# Patient Record
Sex: Male | Born: 1980 | Hispanic: Refuse to answer | Marital: Married | Smoking: Never smoker
Health system: Southern US, Community
[De-identification: ages and names within clinical notes are randomized; demographics above are authoritative.]

## PROBLEM LIST (undated history)

## (undated) DIAGNOSIS — T7840XA Allergy, unspecified, initial encounter: Secondary | ICD-10-CM

## (undated) HISTORY — DX: Allergy, unspecified, initial encounter: T78.40XA

---

## 2016-02-27 DIAGNOSIS — H5213 Myopia, bilateral: Secondary | ICD-10-CM | POA: Diagnosis not present

## 2016-04-09 DIAGNOSIS — H5210 Myopia, unspecified eye: Secondary | ICD-10-CM | POA: Diagnosis not present

## 2016-05-27 DIAGNOSIS — J069 Acute upper respiratory infection, unspecified: Secondary | ICD-10-CM | POA: Diagnosis not present

## 2016-05-27 DIAGNOSIS — J209 Acute bronchitis, unspecified: Secondary | ICD-10-CM | POA: Diagnosis not present

## 2016-05-27 DIAGNOSIS — R739 Hyperglycemia, unspecified: Secondary | ICD-10-CM | POA: Diagnosis not present

## 2016-07-05 DIAGNOSIS — R5383 Other fatigue: Secondary | ICD-10-CM | POA: Insufficient documentation

## 2016-07-28 DIAGNOSIS — Z021 Encounter for pre-employment examination: Secondary | ICD-10-CM | POA: Diagnosis not present

## 2016-07-28 DIAGNOSIS — Z23 Encounter for immunization: Secondary | ICD-10-CM | POA: Diagnosis not present

## 2016-07-28 DIAGNOSIS — Z111 Encounter for screening for respiratory tuberculosis: Secondary | ICD-10-CM | POA: Diagnosis not present

## 2016-08-27 DIAGNOSIS — Z23 Encounter for immunization: Secondary | ICD-10-CM | POA: Diagnosis not present

## 2016-12-28 DIAGNOSIS — N481 Balanitis: Secondary | ICD-10-CM | POA: Diagnosis not present

## 2017-01-30 ENCOUNTER — Ambulatory Visit (INDEPENDENT_AMBULATORY_CARE_PROVIDER_SITE_OTHER): Payer: BLUE CROSS/BLUE SHIELD | Admitting: Emergency Medicine

## 2017-01-30 ENCOUNTER — Encounter: Payer: Self-pay | Admitting: Emergency Medicine

## 2017-01-30 VITALS — BP 115/75 | HR 62 | Temp 98.5°F | Resp 18 | Ht 69.0 in | Wt 170.0 lb

## 2017-01-30 DIAGNOSIS — Z23 Encounter for immunization: Secondary | ICD-10-CM | POA: Diagnosis not present

## 2017-01-30 DIAGNOSIS — Z0000 Encounter for general adult medical examination without abnormal findings: Secondary | ICD-10-CM

## 2017-01-30 NOTE — Progress Notes (Signed)
Nicholas Duffy 36 y.o.   Chief Complaint  Patient presents with  . Establish Care  . Immunizations    3RD HEP B. Pt has immunization record    HISTORY OF PRESENT ILLNESS: This is a 36 y.o. male has no complaints; needs to establish care.  HPI   Prior to Admission medications   Not on File    No Known Allergies  There are no active problems to display for this patient.   No past medical history on file.  No past surgical history on file.  Social History   Social History  . Marital status: Married    Spouse name: N/A  . Number of children: N/A  . Years of education: N/A   Occupational History  . Not on file.   Social History Main Topics  . Smoking status: Never Smoker  . Smokeless tobacco: Never Used  . Alcohol use Not on file  . Drug use: Unknown  . Sexual activity: Not on file   Other Topics Concern  . Not on file   Social History Narrative  . No narrative on file    No family history on file.   Review of Systems  Constitutional: Negative.  Negative for chills, fever and weight loss.  HENT: Negative.  Negative for congestion, hearing loss and sore throat.   Eyes: Negative.  Negative for blurred vision, double vision, discharge and redness.  Respiratory: Negative.  Negative for cough and shortness of breath.   Cardiovascular: Negative.  Negative for chest pain, palpitations and leg swelling.  Gastrointestinal: Negative.  Negative for abdominal pain, diarrhea, nausea and vomiting.  Genitourinary: Negative.  Negative for dysuria and hematuria.       Intermittent trouble urinating, chronic; was evaluated by Urologist in [place] last year.  Musculoskeletal: Negative.  Negative for back pain, myalgias and neck pain.  Skin: Negative.  Negative for rash.  Neurological: Negative for dizziness, sensory change, focal weakness and headaches.  Endo/Heme/Allergies: Negative.   All other systems reviewed and are negative.  Vitals:   01/30/17 1009  BP:  115/75  Pulse: 62  Resp: 18  Temp: 98.5 F (36.9 C)     Physical Exam  Constitutional: He is oriented to person, place, and time. He appears well-developed and well-nourished.  HENT:  Head: Normocephalic and atraumatic.  Right Ear: External ear normal.  Left Ear: External ear normal.  Nose: Nose normal.  Mouth/Throat: Oropharynx is clear and moist. No oropharyngeal exudate.  Eyes: Conjunctivae and EOM are normal. Pupils are equal, round, and reactive to light.  Neck: Normal range of motion. Neck supple. No JVD present. No thyromegaly present.  Cardiovascular: Normal rate, regular rhythm and normal heart sounds.   Pulmonary/Chest: Effort normal and breath sounds normal.  Abdominal: Soft. Bowel sounds are normal. He exhibits no distension. There is no tenderness.  Musculoskeletal: Normal range of motion.  Lymphadenopathy:    He has no cervical adenopathy.  Neurological: He is alert and oriented to person, place, and time. No sensory deficit. He exhibits normal muscle tone.  Skin: Skin is warm and dry. Capillary refill takes less than 2 seconds. No rash noted.  Psychiatric: He has a normal mood and affect. His behavior is normal.  Vitals reviewed.    ASSESSMENT & PLAN: [name] was seen today for establish care and immunizations.  Diagnoses and all orders for this visit:  Routine general medical examination at a health care facility -     CBC with Differential/Platelet -  Comprehensive metabolic panel -     Hemoglobin A1c -     PSA -     Ambulatory referral to Urology -     Lipid panel  Other orders -     Hepatitis B vaccine adult IM    Patient Instructions       IF you received an x-ray today, you will receive an invoice from [facility]. Please contact [facility] at [phone] with questions or concerns regarding your invoice.   IF you received labwork today, you will receive an invoice from [facility]. Please contact [facility] at  [phone] with questions or concerns regarding your invoice.   Our billing staff will not be able to assist you with questions regarding bills from these companies.  You will be contacted with the lab results as soon as they are available. The fastest way to get your results is to activate your My Chart account. Instructions are located on the last page of this paperwork. If you have not heard from [place] regarding the results in 2 weeks, please contact this office.        Health Maintenance, Male A healthy lifestyle and preventive care is important for your health and wellness. Ask your health care provider about what schedule of regular examinations is right for you. What should I know about weight and diet? Eat a Healthy Diet  Eat plenty of vegetables, fruits, whole grains, low-fat dairy products, and lean protein.  Do not eat a lot of foods high in solid fats, added sugars, or salt.  Maintain a Healthy Weight Regular exercise can help you achieve or maintain a healthy weight. You should:  Do at least 150 minutes of exercise each week. The exercise should increase your heart rate and make you sweat (moderate-intensity exercise).  Do strength-training exercises at least twice a week.  Watch Your Levels of Cholesterol and Blood Lipids  Have your blood tested for lipids and cholesterol every 5 years starting at 37 years of age. If you are at high risk for heart disease, you should start having your blood tested when you are 37 years old. You may need to have your cholesterol levels checked more often if: ? Your lipid or cholesterol levels are high. ? You are older than 37 years of age. ? You are at high risk for heart disease.  What should I know about cancer screening? Many types of cancers can be detected early and may often be prevented. Lung Cancer  You should be screened every year for lung cancer if: ? You are a current smoker who has smoked for at least 30 years. ? You  are a former smoker who has quit within the past 15 years.  Talk to your health care provider about your screening options, when you should start screening, and how often you should be screened.  Colorectal Cancer  Routine colorectal cancer screening usually begins at 37 years of age and should be repeated every 5-10 years until you are 37 years old. You may need to be screened more often if early forms of precancerous polyps or small growths are found. Your health care provider may recommend screening at an earlier age if you have risk factors for colon cancer.  Your health care provider may recommend using home test kits to check for hidden blood in the stool.  A small camera at the end of a tube can be used to examine your colon (sigmoidoscopy or colonoscopy). This checks for the earliest forms of  colorectal cancer.  Prostate and Testicular Cancer  Depending on your age and overall health, your health care provider may do certain tests to screen for prostate and testicular cancer.  Talk to your health care provider about any symptoms or concerns you have about testicular or prostate cancer.  Skin Cancer  Check your skin from head to toe regularly.  Tell your health care provider about any new moles or changes in moles, especially if: ? There is a change in a mole's size, shape, or color. ? You have a mole that is larger than a pencil eraser.  Always use sunscreen. Apply sunscreen liberally and repeat throughout the day.  Protect yourself by wearing long sleeves, pants, a wide-brimmed hat, and sunglasses when outside.  What should I know about heart disease, diabetes, and high blood pressure?  If you are 8218-37 years of age, have your blood pressure checked every 3-5 years. If you are 37 years of age or older, have your blood pressure checked every year. You should have your blood pressure measured twice-once when you are at a hospital or clinic, and once when you are not at a  hospital or clinic. Record the average of the two measurements. To check your blood pressure when you are not at a hospital or clinic, you can use: ? An automated blood pressure machine at a pharmacy. ? A home blood pressure monitor.  Talk to your health care provider about your target blood pressure.  If you are between 2445-37 years old, ask your health care provider if you should take aspirin to prevent heart disease.  Have regular diabetes screenings by checking your fasting blood sugar level. ? If you are at a normal weight and have a low risk for diabetes, have this test once every three years after the age of 545. ? If you are overweight and have a high risk for diabetes, consider being tested at a younger age or more often.  A one-time screening for abdominal aortic aneurysm (AAA) by ultrasound is recommended for men aged 65-75 years who are current or former smokers. What should I know about preventing infection? Hepatitis B If you have a higher risk for hepatitis B, you should be screened for this virus. Talk with your health care provider to find out if you are at risk for hepatitis B infection. Hepatitis C Blood testing is recommended for:  Everyone born from [birth year] through 1965.  Anyone with known risk factors for hepatitis C.  Sexually Transmitted Diseases (STDs)  You should be screened each year for STDs including gonorrhea and chlamydia if: ? You are sexually active and are younger than 37 years of age. ? You are older than 37 years of age and your health care provider tells you that you are at risk for this type of infection. ? Your sexual activity has changed since you were last screened and you are at an increased risk for chlamydia or gonorrhea. Ask your health care provider if you are at risk.  Talk with your health care provider about whether you are at high risk of being infected with HIV. Your health care provider may recommend a prescription medicine to help prevent  HIV infection.  What else can I do?  Schedule regular health, dental, and eye exams.  Stay current with your vaccines (immunizations).  Do not use any tobacco products, such as cigarettes, chewing tobacco, and e-cigarettes. If you need help quitting, ask your health care provider.  Limit alcohol intake to no  more than 2 drinks per day. One drink equals 12 ounces of beer, 5 ounces of wine, or 1 ounces of hard liquor.  Do not use street drugs.  Do not share needles.  Ask your health care provider for help if you need support or information about quitting drugs.  Tell your health care provider if you often feel depressed.  Tell your health care provider if you have ever been abused or do not feel safe at home. This information is not intended to replace advice given to you by your health care provider. Make sure you discuss any questions you have with your health care provider. Document Released: 01/31/2008 Document Revised: 04/02/2016 Document Reviewed: 05/08/2015 Elsevier Interactive Patient Education  2018 [employer].  American Heart Association (AHA) Exercise Recommendation  Being physically active is important to prevent heart disease and stroke, the nation's No. 1and No. 5killers. To improve overall cardiovascular health, we suggest at least 150 minutes per week of moderate exercise or 75 minutes per week of vigorous exercise (or a combination of moderate and vigorous activity). Thirty minutes a day, five times a week is an easy goal to remember. You will also experience benefits even if you divide your time into two or three segments of 10 to 15 minutes per day.  For people who would benefit from lowering their blood pressure or cholesterol, we recommend 40 minutes of aerobic exercise of moderate to vigorous intensity three to four times a week to lower the risk for heart attack and stroke.  Physical activity is anything that makes you move your body and burn calories.  This  includes things like climbing stairs or playing sports. Aerobic exercises benefit your heart, and include walking, jogging, swimming or biking. Strength and stretching exercises are best for overall stamina and flexibility.  The simplest, positive change you can make to effectively improve your heart health is to start walking. It's enjoyable, free, easy, social and great exercise. A walking program is flexible and boasts high success rates because people can stick with it. It's easy for walking to become a regular and satisfying part of life.   For Overall Cardiovascular Health:  At least 30 minutes of moderate-intensity aerobic activity at least 5 days per week for a total of 150  OR   At least 25 minutes of vigorous aerobic activity at least 3 days per week for a total of 75 minutes; or a combination of moderate- and vigorous-intensity aerobic activity  AND   Moderate- to high-intensity muscle-strengthening activity at least 2 days per week for additional health benefits.  For Lowering Blood Pressure and Cholesterol  An average 40 minutes of moderate- to vigorous-intensity aerobic activity 3 or 4 times per week  What if I can't make it to the time goal? Something is always better than nothing! And everyone has to start somewhere. Even if you've been sedentary for years, today is the day you can begin to make healthy changes in your life. If you don't think you'll make it for 30 or 40 minutes, set a reachable goal for today. You can work up toward your overall goal by increasing your time as you get stronger. Don't let all-or-nothing thinking rob you of doing what you can every day.  Source:[email or web address].[email or web address], MD [facility]

## 2017-01-30 NOTE — Patient Instructions (Addendum)
   IF you received an x-ray today, you will receive an invoice from [facility]. Please contact [facility] at [phone] with questions or concerns regarding your invoice.   IF you received labwork today, you will receive an invoice from [facility]. Please contact [facility] at [phone] with questions or concerns regarding your invoice.   Our billing staff will not be able to assist you with questions regarding bills from these companies.  You will be contacted with the lab results as soon as they are available. The fastest way to get your results is to activate your My Chart account. Instructions are located on the last page of this paperwork. If you have not heard from us regarding the results in 2 weeks, please contact this office.      Health Maintenance, Male A healthy lifestyle and preventive care is important for your health and wellness. Ask your health care provider about what schedule of regular examinations is right for you. What should I know about weight and diet? Eat a Healthy Diet  Eat plenty of vegetables, fruits, whole grains, low-fat dairy products, and lean protein.  Do not eat a lot of foods high in solid fats, added sugars, or salt.  Maintain a Healthy Weight Regular exercise can help you achieve or maintain a healthy weight. You should:  Do at least 150 minutes of exercise each week. The exercise should increase your heart rate and make you sweat (moderate-intensity exercise).  Do strength-training exercises at least twice a week.  Watch Your Levels of Cholesterol and Blood Lipids  Have your blood tested for lipids and cholesterol every 5 years starting at 37 years of age. If you are at high risk for heart disease, you should start having your blood tested when you are 37 years old. You may need to have your cholesterol levels checked more often if: ? Your lipid or cholesterol levels are high. ? You are older than 37 years of age. ? You  are at high risk for heart disease.  What should I know about cancer screening? Many types of cancers can be detected early and may often be prevented. Lung Cancer  You should be screened every year for lung cancer if: ? You are a current smoker who has smoked for at least 30 years. ? You are a former smoker who has quit within the past 15 years.  Talk to your health care provider about your screening options, when you should start screening, and how often you should be screened.  Colorectal Cancer  Routine colorectal cancer screening usually begins at 37 years of age and should be repeated every 5-10 years until you are 37 years old. You may need to be screened more often if early forms of precancerous polyps or small growths are found. Your health care provider may recommend screening at an earlier age if you have risk factors for colon cancer.  Your health care provider may recommend using home test kits to check for hidden blood in the stool.  A small camera at the end of a tube can be used to examine your colon (sigmoidoscopy or colonoscopy). This checks for the earliest forms of colorectal cancer.  Prostate and Testicular Cancer  Depending on your age and overall health, your health care provider may do certain tests to screen for prostate and testicular cancer.  Talk to your health care provider about any symptoms or concerns you have about testicular or prostate cancer.  Skin Cancer  Check your skin   from head to toe regularly.  Tell your health care provider about any new moles or changes in moles, especially if: ? There is a change in a mole's size, shape, or color. ? You have a mole that is larger than a pencil eraser.  Always use sunscreen. Apply sunscreen liberally and repeat throughout the day.  Protect yourself by wearing long sleeves, pants, a wide-brimmed hat, and sunglasses when outside.  What should I know about heart disease, diabetes, and high blood  pressure?  If you are 18-39 years of age, have your blood pressure checked every 3-5 years. If you are 40 years of age or older, have your blood pressure checked every year. You should have your blood pressure measured twice-once when you are at a hospital or clinic, and once when you are not at a hospital or clinic. Record the average of the two measurements. To check your blood pressure when you are not at a hospital or clinic, you can use: ? An automated blood pressure machine at a pharmacy. ? A home blood pressure monitor.  Talk to your health care provider about your target blood pressure.  If you are between 45-79 years old, ask your health care provider if you should take aspirin to prevent heart disease.  Have regular diabetes screenings by checking your fasting blood sugar level. ? If you are at a normal weight and have a low risk for diabetes, have this test once every three years after the age of 45. ? If you are overweight and have a high risk for diabetes, consider being tested at a younger age or more often.  A one-time screening for abdominal aortic aneurysm (AAA) by ultrasound is recommended for men aged 65-75 years who are current or former smokers. What should I know about preventing infection? Hepatitis B If you have a higher risk for hepatitis B, you should be screened for this virus. Talk with your health care provider to find out if you are at risk for hepatitis B infection. Hepatitis C Blood testing is recommended for:  Everyone born from 1945 through 1965.  Anyone with known risk factors for hepatitis C.  Sexually Transmitted Diseases (STDs)  You should be screened each year for STDs including gonorrhea and chlamydia if: ? You are sexually active and are younger than 37 years of age. ? You are older than 37 years of age and your health care provider tells you that you are at risk for this type of infection. ? Your sexual activity has changed since you were last  screened and you are at an increased risk for chlamydia or gonorrhea. Ask your health care provider if you are at risk.  Talk with your health care provider about whether you are at high risk of being infected with HIV. Your health care provider may recommend a prescription medicine to help prevent HIV infection.  What else can I do?  Schedule regular health, dental, and eye exams.  Stay current with your vaccines (immunizations).  Do not use any tobacco products, such as cigarettes, chewing tobacco, and e-cigarettes. If you need help quitting, ask your health care provider.  Limit alcohol intake to no more than 2 drinks per day. One drink equals 12 ounces of beer, 5 ounces of wine, or 1 ounces of hard liquor.  Do not use street drugs.  Do not share needles.  Ask your health care provider for help if you need support or information about quitting drugs.  Tell your health care   provider if you often feel depressed.  Tell your health care provider if you have ever been abused or do not feel safe at home. This information is not intended to replace advice given to you by your health care provider. Make sure you discuss any questions you have with your health care provider. Document Released: 01/31/2008 Document Revised: 04/02/2016 Document Reviewed: 05/08/2015 Elsevier Interactive Patient Education  2018 [employer]  American Heart Association (AHA) Exercise Recommendation  Being physically active is important to prevent heart disease and stroke, the nation's No. 1and No. 5killers. To improve overall cardiovascular health, we suggest at least 150 minutes per week of moderate exercise or 75 minutes per week of vigorous exercise (or a combination of moderate and vigorous activity). Thirty minutes a day, five times a week is an easy goal to remember. You will also experience benefits even if you divide your time into two or three segments of 10 to 15 minutes per day.  For people who would  benefit from lowering their blood pressure or cholesterol, we recommend 40 minutes of aerobic exercise of moderate to vigorous intensity three to four times a week to lower the risk for heart attack and stroke.  Physical activity is anything that makes you move your body and burn calories.  This includes things like climbing stairs or playing sports. Aerobic exercises benefit your heart, and include walking, jogging, swimming or biking. Strength and stretching exercises are best for overall stamina and flexibility.  The simplest, positive change you can make to effectively improve your heart health is to start walking. It's enjoyable, free, easy, social and great exercise. A walking program is flexible and boasts high success rates because people can stick with it. It's easy for walking to become a regular and satisfying part of life.   For Overall Cardiovascular Health:  At least 30 minutes of moderate-intensity aerobic activity at least 5 days per week for a total of 150  OR   At least 25 minutes of vigorous aerobic activity at least 3 days per week for a total of 75 minutes; or a combination of moderate- and vigorous-intensity aerobic activity  AND   Moderate- to high-intensity muscle-strengthening activity at least 2 days per week for additional health benefits.  For Lowering Blood Pressure and Cholesterol  An average 40 minutes of moderate- to vigorous-intensity aerobic activity 3 or 4 times per week  What if I can't make it to the time goal? Something is always better than nothing! And everyone has to start somewhere. Even if you've been sedentary for years, today is the day you can begin to make healthy changes in your life. If you don't think you'll make it for 30 or 40 minutes, set a reachable goal for today. You can work up toward your overall goal by increasing your time as you get stronger. Don't let all-or-nothing thinking rob you of doing what you can every day.   Source:http://www.heart.org    

## 2017-01-31 LAB — LIPID PANEL
CHOLESTEROL TOTAL: 227 mg/dL — AB (ref 100–199)
Chol/HDL Ratio: 7.3 ratio — ABNORMAL HIGH (ref 0.0–5.0)
HDL: 31 mg/dL — AB (ref >39)
LDL CALC: 164 mg/dL — AB (ref 0–99)
TRIGLYCERIDES: 158 mg/dL — AB (ref 0–149)
VLDL Cholesterol Cal: 32 mg/dL (ref 5–40)

## 2017-01-31 LAB — COMPREHENSIVE METABOLIC PANEL
ALBUMIN: 4.6 g/dL (ref 3.5–5.5)
ALK PHOS: 55 IU/L (ref 39–117)
ALT: 86 IU/L — ABNORMAL HIGH (ref 0–44)
AST: 38 IU/L (ref 0–40)
Albumin/Globulin Ratio: 1.8 (ref 1.2–2.2)
BUN / CREAT RATIO: 13 (ref 9–20)
BUN: 11 mg/dL (ref 6–20)
Bilirubin Total: 0.2 mg/dL (ref 0.0–1.2)
CALCIUM: 9.4 mg/dL (ref 8.7–10.2)
CO2: 23 mmol/L (ref 20–29)
CREATININE: 0.83 mg/dL (ref 0.76–1.27)
Chloride: 102 mmol/L (ref 96–106)
GFR calc Af Amer: 131 mL/min/{1.73_m2} (ref >59)
GFR, EST NON AFRICAN AMERICAN: 113 mL/min/{1.73_m2} (ref >59)
GLOBULIN, TOTAL: 2.6 g/dL (ref 1.5–4.5)
GLUCOSE: 116 mg/dL — AB (ref 65–99)
Potassium: 4.4 mmol/L (ref 3.5–5.2)
SODIUM: 143 mmol/L (ref 134–144)
TOTAL PROTEIN: 7.2 g/dL (ref 6.0–8.5)

## 2017-01-31 LAB — CBC WITH DIFFERENTIAL/PLATELET
Basophils Absolute: 0 10*3/uL (ref 0.0–0.2)
Basos: 0 %
EOS (ABSOLUTE): 0 10*3/uL (ref 0.0–0.4)
Eos: 1 %
HEMOGLOBIN: 14.8 g/dL (ref 13.0–17.7)
Hematocrit: 43.5 % (ref 37.5–51.0)
IMMATURE GRANS (ABS): 0 10*3/uL (ref 0.0–0.1)
IMMATURE GRANULOCYTES: 0 %
LYMPHS: 38 %
Lymphocytes Absolute: 2.1 10*3/uL (ref 0.7–3.1)
MCH: 30.2 pg (ref 26.6–33.0)
MCHC: 34 g/dL (ref 31.5–35.7)
MCV: 89 fL (ref 79–97)
MONOCYTES: 4 %
Monocytes Absolute: 0.2 10*3/uL (ref 0.1–0.9)
NEUTROS PCT: 57 %
Neutrophils Absolute: 3.1 10*3/uL (ref 1.4–7.0)
Platelets: 220 10*3/uL (ref 150–379)
RBC: 4.9 x10E6/uL (ref 4.14–5.80)
RDW: 14 % (ref 12.3–15.4)
WBC: 5.5 10*3/uL (ref 3.4–10.8)

## 2017-01-31 LAB — HEMOGLOBIN A1C
ESTIMATED AVERAGE GLUCOSE: 103 mg/dL
HEMOGLOBIN A1C: 5.2 % (ref 4.8–5.6)

## 2017-01-31 LAB — PSA: Prostate Specific Ag, Serum: 0.5 ng/mL (ref 0.0–4.0)

## 2017-03-02 ENCOUNTER — Ambulatory Visit (INDEPENDENT_AMBULATORY_CARE_PROVIDER_SITE_OTHER): Payer: BLUE CROSS/BLUE SHIELD | Admitting: Emergency Medicine

## 2017-03-02 ENCOUNTER — Encounter: Payer: Self-pay | Admitting: Emergency Medicine

## 2017-03-02 VITALS — BP 96/60 | HR 80 | Temp 98.0°F | Resp 16 | Ht 68.75 in | Wt 171.8 lb

## 2017-03-02 DIAGNOSIS — Z789 Other specified health status: Secondary | ICD-10-CM | POA: Diagnosis not present

## 2017-03-02 NOTE — Progress Notes (Signed)
Nicholas Duffy 36 y.o.   Chief Complaint  Patient presents with  . Hep B titer    for school    HISTORY OF PRESENT ILLNESS: This is a 36 y.o. male needs Hep B titers for school.  HPI   Prior to Admission medications   Not on File    No Known Allergies  Patient Active Problem List   Diagnosis Date Noted  . Routine general medical examination at a health care facility 01/30/2017    No past medical history on file.  No past surgical history on file.  Social History   Social History  . Marital status: Married    Spouse name: N/A  . Number of children: N/A  . Years of education: N/A   Occupational History  . Not on file.   Social History Main Topics  . Smoking status: Never Smoker  . Smokeless tobacco: Never Used  . Alcohol use Not on file  . Drug use: Unknown  . Sexual activity: Not on file   Other Topics Concern  . Not on file   Social History Narrative  . No narrative on file    No family history on file.   Review of Systems  Constitutional: Negative.  Negative for chills and fever.  Respiratory: Negative for cough and shortness of breath.   Cardiovascular: Negative for chest pain.  Gastrointestinal: Negative for abdominal pain, diarrhea, nausea and vomiting.  Genitourinary: Negative.   Skin: Negative for rash.  Neurological: Negative.   All other systems reviewed and are negative.    Vitals:   03/02/17 0957  BP: 96/60  Pulse: 80  Resp: 16  Temp: 98 F (36.7 C)     Physical Exam  Constitutional: He is oriented to person, place, and time. He appears well-developed and well-nourished.  HENT:  Head: Normocephalic and atraumatic.  Eyes: Pupils are equal, round, and reactive to light. EOM are normal.  Pulmonary/Chest: Effort normal.  Musculoskeletal: Normal range of motion.  Neurological: He is alert and oriented to person, place, and time.  Skin: Skin is warm. Capillary refill takes less than 2 seconds.  Psychiatric: He has a  normal mood and affect. His behavior is normal.  Vitals reviewed.    ASSESSMENT & PLAN: Chesley MiresGhaith was seen today for hep b titer.  Diagnoses and all orders for this visit:  Hepatitis B vaccination status unknown -     Hepatitis B surface antibody      Edwina BarthMiguel Yacob Wilkerson, MD [facility]

## 2017-03-02 NOTE — Patient Instructions (Signed)
     IF you received an x-ray today, you will receive an invoice from [facility]. Please contact [facility] at [phone] with questions or concerns regarding your invoice.   IF you received labwork today, you will receive an invoice from [facility]. Please contact [facility] at [phone] with questions or concerns regarding your invoice.   Our billing staff will not be able to assist you with questions regarding bills from these companies.  You will be contacted with the lab results as soon as they are available. The fastest way to get your results is to activate your My Chart account. Instructions are located on the last page of this paperwork. If you have not heard from us regarding the results in 2 weeks, please contact this office.     

## 2017-03-03 LAB — HEPATITIS B SURFACE ANTIBODY, QUANTITATIVE

## 2017-03-04 ENCOUNTER — Encounter: Payer: Self-pay | Admitting: Radiology

## 2017-03-18 ENCOUNTER — Encounter: Payer: Self-pay | Admitting: Urgent Care

## 2017-03-18 ENCOUNTER — Ambulatory Visit (INDEPENDENT_AMBULATORY_CARE_PROVIDER_SITE_OTHER): Payer: BLUE CROSS/BLUE SHIELD | Admitting: Urgent Care

## 2017-03-18 VITALS — BP 116/77 | HR 73 | Temp 97.5°F | Resp 18 | Ht 69.0 in | Wt 170.4 lb

## 2017-03-18 DIAGNOSIS — J0100 Acute maxillary sinusitis, unspecified: Secondary | ICD-10-CM | POA: Diagnosis not present

## 2017-03-18 DIAGNOSIS — J3489 Other specified disorders of nose and nasal sinuses: Secondary | ICD-10-CM | POA: Diagnosis not present

## 2017-03-18 DIAGNOSIS — J3089 Other allergic rhinitis: Secondary | ICD-10-CM | POA: Diagnosis not present

## 2017-03-18 DIAGNOSIS — R0981 Nasal congestion: Secondary | ICD-10-CM

## 2017-03-18 MED ORDER — AMOXICILLIN 500 MG PO CAPS: 500.0000 mg | ORAL_CAPSULE | Freq: Three times a day (TID) | ORAL | 0 refills | Status: DC

## 2017-03-18 MED ORDER — PSEUDOEPHEDRINE HCL ER 120 MG PO TB12: 120.0000 mg | ORAL_TABLET | Freq: Two times a day (BID) | ORAL | 3 refills | Status: DC

## 2017-03-18 MED ORDER — CETIRIZINE HCL 10 MG PO TABS: 10.0000 mg | ORAL_TABLET | Freq: Every day | ORAL | 11 refills | Status: AC

## 2017-03-18 NOTE — Patient Instructions (Addendum)
Sinusitis, Adult Sinusitis is soreness and inflammation of your sinuses. Sinuses are hollow spaces in the bones around your face. Your sinuses are located:  Around your eyes.  In the middle of your forehead.  Behind your nose.  In your cheekbones.  Your sinuses and nasal passages are lined with a stringy fluid (mucus). Mucus normally drains out of your sinuses. When your nasal tissues become inflamed or swollen, the mucus can become trapped or blocked so air cannot flow through your sinuses. This allows bacteria, viruses, and funguses to grow, which leads to infection. Sinusitis can develop quickly and last for 7?10 days (acute) or for more than 12 weeks (chronic). Sinusitis often develops after a cold. What are the causes? This condition is caused by anything that creates swelling in the sinuses or stops mucus from draining, including:  Allergies.  Asthma.  Bacterial or viral infection.  Abnormally shaped bones between the nasal passages.  Nasal growths that contain mucus (nasal polyps).  Narrow sinus openings.  Pollutants, such as chemicals or irritants in the air.  A foreign object stuck in the nose.  A fungal infection. This is rare.  What increases the risk? The following factors may make you more likely to develop this condition:  Having allergies or asthma.  Having had a recent cold or respiratory tract infection.  Having structural deformities or blockages in your nose or sinuses.  Having a weak immune system.  Doing a lot of swimming or diving.  Overusing nasal sprays.  Smoking.  What are the signs or symptoms? The main symptoms of this condition are pain and a feeling of pressure around the affected sinuses. Other symptoms include:  Upper toothache.  Earache.  Headache.  Bad breath.  Decreased sense of smell and taste.  A cough that may get worse at night.  Fatigue.  Fever.  Thick drainage from your nose. The drainage is often green and  it may contain pus (purulent).  Stuffy nose or congestion.  Postnasal drip. This is when extra mucus collects in the throat or back of the nose.  Swelling and warmth over the affected sinuses.  Sore throat.  Sensitivity to light.  How is this diagnosed? This condition is diagnosed based on symptoms, a medical history, and a physical exam. To find out if your condition is acute or chronic, your health care provider may:  Look in your nose for signs of nasal polyps.  Tap over the affected sinus to check for signs of infection.  View the inside of your sinuses using an imaging device that has a light attached (endoscope).  If your health care provider suspects that you have chronic sinusitis, you may also:  Be tested for allergies.  Have a sample of mucus taken from your nose (nasal culture) and checked for bacteria.  Have a mucus sample examined to see if your sinusitis is related to an allergy.  If your sinusitis does not respond to treatment and it lasts longer than 8 weeks, you may have an MRI or CT scan to check your sinuses. These scans also help to determine how severe your infection is. In rare cases, a bone biopsy may be done to rule out more serious types of fungal sinus disease. How is this treated? Treatment for sinusitis depends on the cause and whether your condition is chronic or acute. If a virus is causing your sinusitis, your symptoms will go away on their own within 10 days. You may be given medicines to relieve your symptoms,   including:  Topical nasal decongestants. They shrink swollen nasal passages and let mucus drain from your sinuses.  Antihistamines. These drugs block inflammation that is triggered by allergies. This can help to ease swelling in your nose and sinuses.  Topical nasal corticosteroids. These are nasal sprays that ease inflammation and swelling in your nose and sinuses.  Nasal saline washes. These rinses can help to get rid of thick mucus in  your nose.  If your condition is caused by bacteria, you will be given an antibiotic medicine. If your condition is caused by a fungus, you will be given an antifungal medicine. Surgery may be needed to correct underlying conditions, such as narrow nasal passages. Surgery may also be needed to remove polyps. Follow these instructions at home: Medicines  Take, use, or apply over-the-counter and prescription medicines only as told by your health care provider. These may include nasal sprays.  If you were prescribed an antibiotic medicine, take it as told by your health care provider. Do not stop taking the antibiotic even if you start to feel better. Hydrate and Humidify  Drink enough water to keep your urine clear or pale yellow. Staying hydrated will help to thin your mucus.  Use a cool mist humidifier to keep the humidity level in your home above 50%.  Inhale steam for 10-15 minutes, 3-4 times a day or as told by your health care provider. You can do this in the bathroom while a hot shower is running.  Limit your exposure to cool or dry air. Rest  Rest as much as possible.  Sleep with your head raised (elevated).  Make sure to get enough sleep each night. General instructions  Apply a warm, moist washcloth to your face 3-4 times a day or as told by your health care provider. This will help with discomfort.  Wash your hands often with soap and water to reduce your exposure to viruses and other germs. If soap and water are not available, use hand sanitizer.  Do not smoke. Avoid being around people who are smoking (secondhand smoke).  Keep all follow-up visits as told by your health care provider. This is important. Contact a health care provider if:  You have a fever.  Your symptoms get worse.  Your symptoms do not improve within 10 days. Get help right away if:  You have a severe headache.  You have persistent vomiting.  You have pain or swelling around your face or  eyes.  You have vision problems.  You develop confusion.  Your neck is stiff.  You have trouble breathing. This information is not intended to replace advice given to you by your health care provider. Make sure you discuss any questions you have with your health care provider. Document Released: 08/04/2005 Document Revised: 03/30/2016 Document Reviewed: 05/30/2015 Elsevier Interactive Patient Education  2017 Elsevier Inc.    Allergic Rhinitis Allergic rhinitis is when the mucous membranes in the nose respond to allergens. Allergens are particles in the air that cause your body to have an allergic reaction. This causes you to release allergic antibodies. Through a chain of events, these eventually cause you to release histamine into the blood stream. Although meant to protect the body, it is this release of histamine that causes your discomfort, such as frequent sneezing, congestion, and an itchy, runny nose. What are the causes? Seasonal allergic rhinitis (hay fever) is caused by pollen allergens that may come from grasses, trees, and weeds. Year-round allergic rhinitis (perennial allergic rhinitis) is caused  by allergens such as house dust mites, pet dander, and mold spores. What are the signs or symptoms?  Nasal stuffiness (congestion).  Itchy, runny nose with sneezing and tearing of the eyes. How is this diagnosed? Your health care provider can help you determine the allergen or allergens that trigger your symptoms. If you and your health care provider are unable to determine the allergen, skin or blood testing may be used. Your health care provider will diagnose your condition after taking your health history and performing a physical exam. Your health care provider may assess you for other related conditions, such as asthma, pink eye, or an ear infection. How is this treated? Allergic rhinitis does not have a cure, but it can be controlled by:  Medicines that block allergy  symptoms. These may include allergy shots, nasal sprays, and oral antihistamines.  Avoiding the allergen.  Hay fever may often be treated with antihistamines in pill or nasal spray forms. Antihistamines block the effects of histamine. There are over-the-counter medicines that may help with nasal congestion and swelling around the eyes. Check with your health care provider before taking or giving this medicine. If avoiding the allergen or the medicine prescribed do not work, there are many new medicines your health care provider can prescribe. Stronger medicine may be used if initial measures are ineffective. Desensitizing injections can be used if medicine and avoidance does not work. Desensitization is when a patient is given ongoing shots until the body becomes less sensitive to the allergen. Make sure you follow up with your health care provider if problems continue. Follow these instructions at home: It is not possible to completely avoid allergens, but you can reduce your symptoms by taking steps to limit your exposure to them. It helps to know exactly what you are allergic to so that you can avoid your specific triggers. Contact a health care provider if:  You have a fever.  You develop a cough that does not stop easily (persistent).  You have shortness of breath.  You start wheezing.  Symptoms interfere with normal daily activities. This information is not intended to replace advice given to you by your health care provider. Make sure you discuss any questions you have with your health care provider. Document Released: 04/29/2001 Document Revised: 04/04/2016 Document Reviewed: 04/11/2013 Elsevier Interactive Patient Education  2017 [employer]     IF you received an x-ray today, you [facility] an [facility]. Please contact [facility] at [phone] with questions or concerns regarding your invoice.   IF you received labwork today, you will  receive an invoice from [facility]. Please contact [facility] at [phone] with questions or concerns regarding your invoice.   Our billing staff will not be able to assist you with questions regarding bills from these companies.  You will be contacted with the lab results as soon as they are available. The fastest way to get your results is to activate your My Chart account. Instructions are located on the last page of this paperwork. If you have not heard from us regarding the results in 2 weeks, please contact this office.

## 2017-03-18 NOTE — Progress Notes (Signed)
  MRN: [id number] DOB: 1981/02/20  Subjective:   Nicholas Duffy is a 36 y.o. male presenting for chief complaint of Sinus Problem and Headache  Reports 5 day history of worsening sinus pain, sinus headaches, thick mucus from nasal congestion, sore throat, tooth pain. Admits that he has discussed this before at his previous visit and states that his symptoms have gotten worse. Has tried APAP with antihistamines with some relief. Admits having chronic issues with allergies but does not take anything consistently for this. Denies fever, ear pain, ear drainage, cough. Denies smoking cigarettes.  Chesley MiresGhaith is not currently taking any medications. Also has No Known Allergies.  Chesley MiresGhaith  has a past medical history of Allergy. Also denies past surgical history.   Objective:   Vitals: BP 116/77 (BP Location: Right Arm, Patient Position: Sitting, Cuff Size: Normal)   Pulse 73   Temp (!) 97.5 F (36.4 C) (Oral)   Resp 18   Ht 5\' 9"  (1.753 m)   Wt 170 lb 6.4 oz (77.3 kg)   SpO2 96%   BMI 25.16 kg/m   Physical Exam  Constitutional: He is oriented to person, place, and time. He appears well-developed and well-nourished.  HENT:  TM's intact bilaterally, no effusions or erythema. Nasal turbinates erythematous, nasal passages minimally patent. Bilateral maxillary sinus tenderness. Oropharynx with mild post-nasal drainage, mucous membranes moist.  Eyes: Right eye exhibits no discharge. Left eye exhibits no discharge.  Neck: Normal range of motion. Neck supple.  Cardiovascular: Normal rate.   Pulmonary/Chest: Effort normal.  Lymphadenopathy:    He has no cervical adenopathy.  Neurological: He is alert and oriented to person, place, and time.  Skin: Skin is warm and dry.  Psychiatric: He has a normal mood and affect.   Assessment and Plan :   1. Acute non-recurrent maxillary sinusitis 2. Allergic rhinitis due to other allergic trigger, unspecified seasonality 3. Sinus pain 4. Sinus  congestion - Will cover for sinusitis with amoxicillin TID for 1 week. Counseled on management of allergies with Zyrtec as his sinusitis is likely secondary to uncontrolled allergies. Used Sudafed as needed. Return-to-clinic precautions discussed, patient verbalized understanding.   Wallis BambergMario Osten Janek, PA-C [facility] [phone] 03/18/2017  11:30 AM

## 2017-04-07 DIAGNOSIS — R3912 Poor urinary stream: Secondary | ICD-10-CM | POA: Diagnosis not present

## 2017-04-10 DIAGNOSIS — R3912 Poor urinary stream: Secondary | ICD-10-CM

## 2017-05-19 DIAGNOSIS — R3912 Poor urinary stream: Secondary | ICD-10-CM | POA: Diagnosis not present

## 2017-06-01 DIAGNOSIS — R3912 Poor urinary stream: Secondary | ICD-10-CM

## 2017-06-05 ENCOUNTER — Ambulatory Visit: Payer: BLUE CROSS/BLUE SHIELD | Admitting: Emergency Medicine

## 2017-06-09 ENCOUNTER — Ambulatory Visit
Admission: RE | Admit: 2017-06-09 | Discharge: 2017-06-09 | Disposition: A | Discharge status: Home / Self Care | Payer: BLUE CROSS/BLUE SHIELD | Source: Ambulatory Visit | Attending: Internal Medicine | Admitting: Internal Medicine

## 2017-06-09 ENCOUNTER — Other Ambulatory Visit: Payer: Self-pay | Admitting: Internal Medicine

## 2017-06-09 DIAGNOSIS — J3489 Other specified disorders of nose and nasal sinuses: Secondary | ICD-10-CM

## 2017-07-14 ENCOUNTER — Other Ambulatory Visit: Payer: Self-pay

## 2017-07-14 ENCOUNTER — Ambulatory Visit: Payer: BLUE CROSS/BLUE SHIELD | Admitting: Emergency Medicine

## 2017-07-14 ENCOUNTER — Encounter: Payer: Self-pay | Admitting: Emergency Medicine

## 2017-07-14 VITALS — BP 100/68 | HR 60 | Temp 99.2°F | Resp 16 | Ht 68.0 in | Wt 174.0 lb

## 2017-07-14 DIAGNOSIS — Z0000 Encounter for general adult medical examination without abnormal findings: Secondary | ICD-10-CM | POA: Diagnosis not present

## 2017-07-14 DIAGNOSIS — Z23 Encounter for immunization: Secondary | ICD-10-CM | POA: Diagnosis not present

## 2017-07-14 NOTE — Patient Instructions (Addendum)
   IF you received an x-ray today, you will receive an invoice from [facility]. Please contact [facility] at [phone] with questions or concerns regarding your invoice.   IF you received labwork today, you will receive an invoice from [facility]. Please contact [facility] at [phone] with questions or concerns regarding your invoice.   Our billing staff will not be able to assist you with questions regarding bills from these companies.  You will be contacted with the lab results as soon as they are available. The fastest way to get your results is to activate your My Chart account. Instructions are located on the last page of this paperwork. If you have not heard from us regarding the results in 2 weeks, please contact this office.      Health Maintenance, Male A healthy lifestyle and preventive care is important for your health and wellness. Ask your health care provider about what schedule of regular examinations is right for you. What should I know about weight and diet? Eat a Healthy Diet  Eat plenty of vegetables, fruits, whole grains, low-fat dairy products, and lean protein.  Do not eat a lot of foods high in solid fats, added sugars, or salt.  Maintain a Healthy Weight Regular exercise can help you achieve or maintain a healthy weight. You should:  Do at least 150 minutes of exercise each week. The exercise should increase your heart rate and make you sweat (moderate-intensity exercise).  Do strength-training exercises at least twice a week.  Watch Your Levels of Cholesterol and Blood Lipids  Have your blood tested for lipids and cholesterol every 5 years starting at 37 years of age. If you are at high risk for heart disease, you should start having your blood tested when you are 37 years old. You may need to have your cholesterol levels checked more often if: ? Your lipid or cholesterol levels are high. ? You are older than 37 years of age. ? You  are at high risk for heart disease.  What should I know about cancer screening? Many types of cancers can be detected early and may often be prevented. Lung Cancer  You should be screened every year for lung cancer if: ? You are a current smoker who has smoked for at least 30 years. ? You are a former smoker who has quit within the past 15 years.  Talk to your health care provider about your screening options, when you should start screening, and how often you should be screened.  Colorectal Cancer  Routine colorectal cancer screening usually begins at 37 years of age and should be repeated every 5-10 years until you are 37 years old. You may need to be screened more often if early forms of precancerous polyps or small growths are found. Your health care provider may recommend screening at an earlier age if you have risk factors for colon cancer.  Your health care provider may recommend using home test kits to check for hidden blood in the stool.  A small camera at the end of a tube can be used to examine your colon (sigmoidoscopy or colonoscopy). This checks for the earliest forms of colorectal cancer.  Prostate and Testicular Cancer  Depending on your age and overall health, your health care provider may do certain tests to screen for prostate and testicular cancer.  Talk to your health care provider about any symptoms or concerns you have about testicular or prostate cancer.  Skin Cancer  Check your skin   from head to toe regularly.  Tell your health care provider about any new moles or changes in moles, especially if: ? There is a change in a mole's size, shape, or color. ? You have a mole that is larger than a pencil eraser.  Always use sunscreen. Apply sunscreen liberally and repeat throughout the day.  Protect yourself by wearing long sleeves, pants, a wide-brimmed hat, and sunglasses when outside.  What should I know about heart disease, diabetes, and high blood  pressure?  If you are 18-39 years of age, have your blood pressure checked every 3-5 years. If you are 40 years of age or older, have your blood pressure checked every year. You should have your blood pressure measured twice-once when you are at a hospital or clinic, and once when you are not at a hospital or clinic. Record the average of the two measurements. To check your blood pressure when you are not at a hospital or clinic, you can use: ? An automated blood pressure machine at a pharmacy. ? A home blood pressure monitor.  Talk to your health care provider about your target blood pressure.  If you are between 45-79 years old, ask your health care provider if you should take aspirin to prevent heart disease.  Have regular diabetes screenings by checking your fasting blood sugar level. ? If you are at a normal weight and have a low risk for diabetes, have this test once every three years after the age of 45. ? If you are overweight and have a high risk for diabetes, consider being tested at a younger age or more often.  A one-time screening for abdominal aortic aneurysm (AAA) by ultrasound is recommended for men aged 65-75 years who are current or former smokers. What should I know about preventing infection? Hepatitis B If you have a higher risk for hepatitis B, you should be screened for this virus. Talk with your health care provider to find out if you are at risk for hepatitis B infection. Hepatitis C Blood testing is recommended for:  Everyone born from 1945 through 1965.  Anyone with known risk factors for hepatitis C.  Sexually Transmitted Diseases (STDs)  You should be screened each year for STDs including gonorrhea and chlamydia if: ? You are sexually active and are younger than 37 years of age. ? You are older than 37 years of age and your health care provider tells you that you are at risk for this type of infection. ? Your sexual activity has changed since you were last  screened and you are at an increased risk for chlamydia or gonorrhea. Ask your health care provider if you are at risk.  Talk with your health care provider about whether you are at high risk of being infected with HIV. Your health care provider may recommend a prescription medicine to help prevent HIV infection.  What else can I do?  Schedule regular health, dental, and eye exams.  Stay current with your vaccines (immunizations).  Do not use any tobacco products, such as cigarettes, chewing tobacco, and e-cigarettes. If you need help quitting, ask your health care provider.  Limit alcohol intake to no more than 2 drinks per day. One drink equals 12 ounces of beer, 5 ounces of wine, or 1 ounces of hard liquor.  Do not use street drugs.  Do not share needles.  Ask your health care provider for help if you need support or information about quitting drugs.  Tell your health care   provider if you often feel depressed.  Tell your health care provider if you have ever been abused or do not feel safe at home. This information is not intended to replace advice given to you by your health care provider. Make sure you discuss any questions you have with your health care provider. Document Released: 01/31/2008 Document Revised: 04/02/2016 Document Reviewed: 05/08/2015 Elsevier Interactive Patient Education  2018 Elsevier Inc.  

## 2017-07-14 NOTE — Progress Notes (Signed)
Nicholas Duffy 36 y.o.   Chief Complaint  Patient presents with  . Annual Exam    employment    HISTORY OF PRESENT ILLNESS: This is a 35 y.o. male needs work physical; has no complaints or medical concerns. Needs TB clearance and Hepatitis C, Varicella titers.  HPI   Prior to Admission medications   Medication Sig Start Date End Date Taking? Authorizing Provider  cetirizine (ZYRTEC) 10 MG tablet Take 1 tablet (10 mg total) by mouth daily. Patient not taking: Reported on 07/14/2017 03/18/17   Nicholas Bamberg, PA-C    No Known Allergies  Patient Active Problem List   Diagnosis Date Noted  . Weak urinary stream 04/10/2017  . Hepatitis B vaccination status unknown 03/02/2017  . Routine general medical examination at a health care facility 01/30/2017    Past Medical History:  Diagnosis Date  . Allergy       Social History   Socioeconomic History  . Marital status: Married    Spouse name: Not on file  . Number of children: Not on file  . Years of education: Not on file  . Highest education level: Not on file  Social Needs  . Financial resource strain: Not on file  . Food insecurity - worry: Not on file  . Food insecurity - inability: Not on file  . Transportation needs - medical: Not on file  . Transportation needs - non-medical: Not on file  Occupational History  . Not on file  Tobacco Use  . Smoking status: Never Smoker  . Smokeless tobacco: Never Used  Substance and Sexual Activity  . Alcohol use: Not on file  . Drug use: Not on file  . Sexual activity: Not on file  Other Topics Concern  . Not on file  Social History Narrative  . Not on file    No family history on file.   Review of Systems  Constitutional: Negative.  Negative for chills, fever and weight loss.  HENT: Negative.  Negative for congestion, nosebleeds and sore throat.   Eyes: Negative.  Negative for blurred vision and double vision.  Respiratory: Negative.  Negative for cough and  shortness of breath.   Cardiovascular: Negative.  Negative for chest pain and palpitations.  Gastrointestinal: Negative.  Negative for abdominal pain, diarrhea, nausea and vomiting.  Genitourinary: Negative.  Negative for dysuria and hematuria.  Musculoskeletal: Negative.   Skin: Negative.  Negative for rash.  Endo/Heme/Allergies: Negative.   All other systems reviewed and are negative.  Vitals:   07/14/17 1422  BP: 100/68  Pulse: 60  Resp: 16  Temp: 99.2 F (37.3 C)  SpO2: 99%     Physical Exam  Constitutional: He is oriented to person, place, and time. He appears well-developed and well-nourished.  HENT:  Head: Normocephalic and atraumatic.  Right Ear: External ear normal.  Left Ear: External ear normal.  Nose: Nose normal.  Mouth/Throat: Oropharynx is clear and moist.  Eyes: Conjunctivae and EOM are normal. Pupils are equal, round, and reactive to light.  Neck: Normal range of motion. Neck supple. No thyromegaly present.  Cardiovascular: Normal rate, regular rhythm and normal heart sounds.  No murmur heard. Pulmonary/Chest: Effort normal and breath sounds normal.  Abdominal: Soft. He exhibits no distension. There is no tenderness.  Musculoskeletal: Normal range of motion.  Lymphadenopathy:    He has no cervical adenopathy.  Neurological: He is alert and oriented to person, place, and time. No sensory deficit. He exhibits normal muscle tone.  Skin: Skin  is warm and dry. Capillary refill takes less than 2 seconds. No rash noted.  Psychiatric: He has a normal mood and affect. His behavior is normal.     ASSESSMENT & PLAN:  Nicholas Duffy was seen today for annual exam.  Diagnoses and all orders for this visit:  Routine general medical examination at a health care facility -     Hepatitis C antibody screen -     Varicella zoster antibody, IgG -     QuantiFERON-TB Gold Plus -     HCV Ab w/Rflx to Verification  Need for prophylactic vaccination and inoculation against  influenza -     Flu Vaccine QUAD 36+ mos IM -     Hepatitis C antibody screen    Patient Instructions       IF you received an x-ray today, you will receive an invoice from [facility]. Please contact [facility] at [phone] with questions or concerns regarding your invoice.   IF you received labwork today, you will receive an invoice from [facility]. Please contact [facility] at [phone] with questions or concerns regarding your invoice.   Our billing staff will not be able to assist you with questions regarding bills from these companies.  You will be contacted with the lab results as soon as they are available. The fastest way to get your results is to activate your My Chart account. Instructions are located on the last page of this paperwork. If you have not heard from [place] regarding the results in 2 weeks, please contact this office.      Health Maintenance, Male A healthy lifestyle and preventive care is important for your health and wellness. Ask your health care provider about what schedule of regular examinations is right for you. What should I know about weight and diet? Eat a Healthy Diet  Eat plenty of vegetables, fruits, whole grains, low-fat dairy products, and lean protein.  Do not eat a lot of foods high in solid fats, added sugars, or salt.  Maintain a Healthy Weight Regular exercise can help you achieve or maintain a healthy weight. You should:  Do at least 150 minutes of exercise each week. The exercise should increase your heart rate and make you sweat (moderate-intensity exercise).  Do strength-training exercises at least twice a week.  Watch Your Levels of Cholesterol and Blood Lipids  Have your blood tested for lipids and cholesterol every 5 years starting at 37 years of age. If you are at high risk for heart disease, you should start having your blood tested when you are 37 years old. You may need to have your cholesterol levels  checked more often if: ? Your lipid or cholesterol levels are high. ? You are older than 37 years of age. ? You are at high risk for heart disease.  What should I know about cancer screening? Many types of cancers can be detected early and may often be prevented. Lung Cancer  You should be screened every year for lung cancer if: ? You are a current smoker who has smoked for at least 30 years. ? You are a former smoker who has quit within the past 15 years.  Talk to your health care provider about your screening options, when you should start screening, and how often you should be screened.  Colorectal Cancer  Routine colorectal cancer screening usually begins at 37 years of age and should be repeated every 5-10 years until you are 37 years old. You may need to be screened  more often if early forms of precancerous polyps or small growths are found. Your health care provider may recommend screening at an earlier age if you have risk factors for colon cancer.  Your health care provider may recommend using home test kits to check for hidden blood in the stool.  A small camera at the end of a tube can be used to examine your colon (sigmoidoscopy or colonoscopy). This checks for the earliest forms of colorectal cancer.  Prostate and Testicular Cancer  Depending on your age and overall health, your health care provider may do certain tests to screen for prostate and testicular cancer.  Talk to your health care provider about any symptoms or concerns you have about testicular or prostate cancer.  Skin Cancer  Check your skin from head to toe regularly.  Tell your health care provider about any new moles or changes in moles, especially if: ? There is a change in a mole's size, shape, or color. ? You have a mole that is larger than a pencil eraser.  Always use sunscreen. Apply sunscreen liberally and repeat throughout the day.  Protect yourself by wearing long sleeves, pants, a  wide-brimmed hat, and sunglasses when outside.  What should I know about heart disease, diabetes, and high blood pressure?  If you are 6318-37 years of age, have your blood pressure checked every 3-5 years. If you are 37 years of age or older, have your blood pressure checked every year. You should have your blood pressure measured twice-once when you are at a hospital or clinic, and once when you are not at a hospital or clinic. Record the average of the two measurements. To check your blood pressure when you are not at a hospital or clinic, you can use: ? An automated blood pressure machine at a pharmacy. ? A home blood pressure monitor.  Talk to your health care provider about your target blood pressure.  If you are between 3545-37 years old, ask your health care provider if you should take aspirin to prevent heart disease.  Have regular diabetes screenings by checking your fasting blood sugar level. ? If you are at a normal weight and have a low risk for diabetes, have this test once every three years after the age of 37. ? If you are overweight and have a high risk for diabetes, consider being tested at a younger age or more often.  A one-time screening for abdominal aortic aneurysm (AAA) by ultrasound is recommended for men aged 65-75 years who are current or former smokers. What should I know about preventing infection? Hepatitis B If you have a higher risk for hepatitis B, you should be screened for this virus. Talk with your health care provider to find out if you are at risk for hepatitis B infection. Hepatitis C Blood testing is recommended for:  Everyone born from [birth year] through 1966.  Anyone with known risk factors for hepatitis C.  Sexually Transmitted Diseases (STDs)  You should be screened each year for STDs including gonorrhea and chlamydia if: ? You are sexually active and are younger than 37 years of age. ? You are older than 37 years of age and your health care provider  tells you that you are at risk for this type of infection. ? Your sexual activity has changed since you were last screened and you are at an increased risk for chlamydia or gonorrhea. Ask your health care provider if you are at risk.  Talk with your health care  provider about whether you are at high risk of being infected with HIV. Your health care provider may recommend a prescription medicine to help prevent HIV infection.  What else can I do?  Schedule regular health, dental, and eye exams.  Stay current with your vaccines (immunizations).  Do not use any tobacco products, such as cigarettes, chewing tobacco, and e-cigarettes. If you need help quitting, ask your health care provider.  Limit alcohol intake to no more than 2 drinks per day. One drink equals 12 ounces of beer, 5 ounces of wine, or 1 ounces of hard liquor.  Do not use street drugs.  Do not share needles.  Ask your health care provider for help if you need support or information about quitting drugs.  Tell your health care provider if you often feel depressed.  Tell your health care provider if you have ever been abused or do not feel safe at home. This information is not intended to replace advice given to you by your health care provider. Make sure you discuss any questions you have with your health care provider. Document Released: 01/31/2008 Document Revised: 04/02/2016 Document Reviewed: 05/08/2015 Elsevier Interactive Patient Education  2018 [employer]     Edwina BarthMiguel Simona Rocque, MD [facility]

## 2017-07-16 LAB — QUANTIFERON-TB GOLD PLUS
QUANTIFERON TB2 AG VALUE: 0.04 [IU]/mL
QUANTIFERON-TB GOLD PLUS: NEGATIVE
QuantiFERON Mitogen Value: >10 IU/mL
QuantiFERON Nil Value: 0.03 IU/mL
QuantiFERON TB1 Ag Value: 0.03 IU/mL

## 2017-07-16 LAB — HCV INTERPRETATION

## 2017-07-16 LAB — VARICELLA ZOSTER ANTIBODY, IGG: VARICELLA: 263 {index} (ref >165)

## 2017-07-16 LAB — HEPATITIS C ANTIBODY: Hep C Virus Ab: <0.1 s/co ratio (ref 0.0–0.9)

## 2017-07-16 LAB — HCV AB W/RFLX TO VERIFICATION: HCV Ab: <0.1 s/co ratio (ref 0.0–0.9)

## 2017-07-21 ENCOUNTER — Telehealth: Payer: Self-pay | Admitting: Emergency Medicine

## 2017-07-21 ENCOUNTER — Telehealth: Payer: Self-pay | Admitting: *Deleted

## 2017-07-21 DIAGNOSIS — R3914 Feeling of incomplete bladder emptying: Secondary | ICD-10-CM | POA: Diagnosis not present

## 2017-07-21 DIAGNOSIS — M6281 Muscle weakness (generalized): Secondary | ICD-10-CM | POA: Diagnosis not present

## 2017-07-21 DIAGNOSIS — M62838 Other muscle spasm: Secondary | ICD-10-CM | POA: Diagnosis not present

## 2017-07-21 DIAGNOSIS — R3912 Poor urinary stream: Secondary | ICD-10-CM | POA: Diagnosis not present

## 2017-07-21 NOTE — Telephone Encounter (Signed)
Spoke to patient concerning the form he left to be completed when labs were back. The labs were reviewed and form was completed, he will pick up at the [facility] check-in desk today or this week before 6 pm.

## 2017-07-21 NOTE — Telephone Encounter (Signed)
Per separate note in chart, pt has been advised that paperwork is completed and ready for pick up.

## 2017-07-21 NOTE — Telephone Encounter (Signed)
Copied from CRM [phone]. Topic: Quick Communication - See Telephone Encounter >> Jul 21, 2017  9:24 AM Nicholas Duffy, Nicholas Duffy wrote: CRM for notification. See Telephone encounter for: 07/21/17. Patient called to see if his work paperwork is done and ready for pick up. Please call patient back thanks.

## 2017-11-17 DIAGNOSIS — Z051 Observation and evaluation of newborn for suspected infectious condition ruled out: Secondary | ICD-10-CM | POA: Diagnosis not present

## 2018-05-31 ENCOUNTER — Ambulatory Visit (INDEPENDENT_AMBULATORY_CARE_PROVIDER_SITE_OTHER): Payer: BLUE CROSS/BLUE SHIELD | Admitting: Family Medicine

## 2018-05-31 DIAGNOSIS — Z23 Encounter for immunization: Secondary | ICD-10-CM

## 2018-05-31 NOTE — Progress Notes (Signed)
Pt came in for Influ inj.

## 2018-07-01 ENCOUNTER — Ambulatory Visit (INDEPENDENT_AMBULATORY_CARE_PROVIDER_SITE_OTHER): Payer: BLUE CROSS/BLUE SHIELD | Admitting: Emergency Medicine

## 2018-07-01 ENCOUNTER — Encounter: Payer: Self-pay | Admitting: Emergency Medicine

## 2018-07-01 ENCOUNTER — Other Ambulatory Visit: Payer: Self-pay

## 2018-07-01 VITALS — BP 110/71 | HR 62 | Temp 98.9°F | Resp 16 | Ht 68.25 in | Wt 176.0 lb

## 2018-07-01 DIAGNOSIS — Z13228 Encounter for screening for other metabolic disorders: Secondary | ICD-10-CM

## 2018-07-01 DIAGNOSIS — Z1329 Encounter for screening for other suspected endocrine disorder: Secondary | ICD-10-CM | POA: Diagnosis not present

## 2018-07-01 DIAGNOSIS — Z0000 Encounter for general adult medical examination without abnormal findings: Secondary | ICD-10-CM

## 2018-07-01 DIAGNOSIS — Z130 Encounter for screening for diseases of the blood and blood-forming organs and certain disorders involving the immune mechanism: Secondary | ICD-10-CM

## 2018-07-01 DIAGNOSIS — Z111 Encounter for screening for respiratory tuberculosis: Secondary | ICD-10-CM

## 2018-07-01 DIAGNOSIS — Z13220 Encounter for screening for lipoid disorders: Secondary | ICD-10-CM

## 2018-07-01 NOTE — Patient Instructions (Addendum)

## 2018-07-01 NOTE — Progress Notes (Signed)
Nicholas Duffy 37 y.o.   Chief Complaint  Patient presents with  . Annual Exam    HISTORY OF PRESENT ILLNESS: This is a 37 y.o. male here for annual exam.  Has no complaints or medical concerns.  No chronic medical problems.  No chronic medications.  Non-smoker.  No EtOH user.  Nurse by profession.  Born in [place].  Healthy lifestyle.  Swims regularly.  Adequate sleep and nutrition.  HPI   Prior to Admission medications   Medication Sig Start Date End Date Taking? Authorizing Provider  cetirizine (ZYRTEC) 10 MG tablet Take 1 tablet (10 mg total) by mouth daily. Patient not taking: Reported on 07/14/2017 03/18/17   Wallis Bamberg, PA-C    No Known Allergies  Patient Active Problem List   Diagnosis Date Noted  . Weak urinary stream 04/10/2017  . Hepatitis B vaccination status unknown 03/02/2017  . Routine general medical examination at a health care facility 01/30/2017    Past Medical History:  Diagnosis Date  . Allergy       Social History   Socioeconomic History  . Marital status: Married    Spouse name: Not on file  . Number of children: Not on file  . Years of education: Not on file  . Highest education level: Not on file  Occupational History  . Not on file  Social Needs  . Financial resource strain: Not on file  . Food insecurity:    Worry: Not on file    Inability: Not on file  . Transportation needs:    Medical: Not on file    Non-medical: Not on file  Tobacco Use  . Smoking status: Never Smoker  . Smokeless tobacco: Never Used  Substance and Sexual Activity  . Alcohol use: Not on file  . Drug use: Not on file  . Sexual activity: Not on file  Lifestyle  . Physical activity:    Days per week: Not on file    Minutes per session: Not on file  . Stress: Not on file  Relationships  . Social connections:    Talks on phone: Not on file    Gets together: Not on file    Attends religious service: Not on file    Active member of club or organization:  Not on file    Attends meetings of clubs or organizations: Not on file    Relationship status: Not on file  . Intimate partner violence:    Fear of current or ex partner: Not on file    Emotionally abused: Not on file    Physically abused: Not on file    Forced sexual activity: Not on file  Other Topics Concern  . Not on file  Social History Narrative  . Not on file    No family history on file.   Review of Systems  Constitutional: Negative.  Negative for chills, fever and weight loss.  HENT: Negative.  Negative for congestion, nosebleeds and sore throat.   Eyes: Negative for blurred vision, double vision, discharge and redness.  Respiratory: Negative.  Negative for cough and wheezing.   Cardiovascular: Negative.  Negative for chest pain and palpitations.  Gastrointestinal: Negative.  Negative for abdominal pain, nausea and vomiting.  Genitourinary: Negative.   Musculoskeletal: Negative.  Negative for back pain, myalgias and neck pain.  Skin: Negative.   Neurological: Negative.  Negative for dizziness and headaches.  Endo/Heme/Allergies: Negative.   All other systems reviewed and are negative.   Vitals:   07/01/18  0910  BP: 110/71  Pulse: 62  Resp: 16  Temp: 98.9 F (37.2 C)  SpO2: 97%    Physical Exam  Constitutional: He is oriented to person, place, and time. He appears well-developed and well-nourished.  HENT:  Head: Normocephalic and atraumatic.  Right Ear: External ear normal.  Left Ear: External ear normal.  Nose: Nose normal.  Mouth/Throat: Oropharynx is clear and moist.  Eyes: Pupils are equal, round, and reactive to light. Conjunctivae and EOM are normal.  Neck: Normal range of motion. Neck supple. No thyromegaly present.  Cardiovascular: Normal rate, regular rhythm and normal heart sounds.  Pulmonary/Chest: Effort normal and breath sounds normal.  Abdominal: Soft. Bowel sounds are normal. He exhibits no distension. There is no tenderness.    Musculoskeletal: Normal range of motion.  Lymphadenopathy:    He has no cervical adenopathy.  Neurological: He is alert and oriented to person, place, and time. No sensory deficit. He exhibits normal muscle tone.  Skin: Skin is warm and dry. Capillary refill takes less than 2 seconds.  Psychiatric: He has a normal mood and affect. His behavior is normal.  Vitals reviewed.    ASSESSMENT & PLAN: [name] was seen today for annual exam.  Diagnoses and all orders for this visit:  Routine general medical examination at a health care facility  Screening for lipoid disorders -     Comprehensive metabolic panel -     Lipid panel -     Hemoglobin A1c  Screening for endocrine, metabolic and immunity disorder -     Comprehensive metabolic panel -     Lipid panel -     Hemoglobin A1c  Screening-pulmonary TB -     QuantiFERON-TB Gold Plus    Patient Instructions       If you have lab work done today you will be contacted with your lab results within the next 2 weeks.  If you have not heard from [place] then please contact us. The fastest way to get your results is to register for My Chart.   IF you received an x-ray today, you will receive an invoice from [facility]. Please contact [facility] at [phone] with questions or concerns regarding your invoice.   IF you received labwork today, you will receive an invoice from [facility]. Please contact [facility] at [phone] with questions or concerns regarding your invoice.   Our billing staff will not be able to assist you with questions regarding bills from these companies.  You will be contacted with the lab results as soon as they are available. The fastest way to get your results is to activate your My Chart account. Instructions are located on the last page of this paperwork. If you have not heard from [place] regarding the results in 2 weeks, please contact this office.      Health Maintenance, Male A healthy  lifestyle and preventive care is important for your health and wellness. Ask your health care provider about what schedule of regular examinations is right for you. What should I know about weight and diet? Eat a Healthy Diet  Eat plenty of vegetables, fruits, whole grains, low-fat dairy products, and lean protein.  Do not eat a lot of foods high in solid fats, added sugars, or salt.  Maintain a Healthy Weight Regular exercise can help you achieve or maintain a healthy weight. You should:  Do at least 150 minutes of exercise each week. The exercise should increase your heart rate and make you sweat (moderate-intensity  exercise).  Do strength-training exercises at least twice a week.  Watch Your Levels of Cholesterol and Blood Lipids  Have your blood tested for lipids and cholesterol every 5 years starting at 38 years of age. If you are at high risk for heart disease, you should start having your blood tested when you are 38 years old. You may need to have your cholesterol levels checked more often if: ? Your lipid or cholesterol levels are high. ? You are older than 38 years of age. ? You are at high risk for heart disease.  What should I know about cancer screening? Many types of cancers can be detected early and may often be prevented. Lung Cancer  You should be screened every year for lung cancer if: ? You are a current smoker who has smoked for at least 30 years. ? You are a former smoker who has quit within the past 15 years.  Talk to your health care provider about your screening options, when you should start screening, and how often you should be screened.  Colorectal Cancer  Routine colorectal cancer screening usually begins at 38 years of age and should be repeated every 5-10 years until you are 38 years old. You may need to be screened more often if early forms of precancerous polyps or small growths are found. Your health care provider may recommend screening at an  earlier age if you have risk factors for colon cancer.  Your health care provider may recommend using home test kits to check for hidden blood in the stool.  A small camera at the end of a tube can be used to examine your colon (sigmoidoscopy or colonoscopy). This checks for the earliest forms of colorectal cancer.  Prostate and Testicular Cancer  Depending on your age and overall health, your health care provider may do certain tests to screen for prostate and testicular cancer.  Talk to your health care provider about any symptoms or concerns you have about testicular or prostate cancer.  Skin Cancer  Check your skin from head to toe regularly.  Tell your health care provider about any new moles or changes in moles, especially if: ? There is a change in a mole's size, shape, or color. ? You have a mole that is larger than a pencil eraser.  Always use sunscreen. Apply sunscreen liberally and repeat throughout the day.  Protect yourself by wearing long sleeves, pants, a wide-brimmed hat, and sunglasses when outside.  What should I know about heart disease, diabetes, and high blood pressure?  If you are 81-37 years of age, have your blood pressure checked every 3-5 years. If you are 60 years of age or older, have your blood pressure checked every year. You should have your blood pressure measured twice-once when you are at a hospital or clinic, and once when you are not at a hospital or clinic. Record the average of the two measurements. To check your blood pressure when you are not at a hospital or clinic, you can use: ? An automated blood pressure machine at a pharmacy. ? A home blood pressure monitor.  Talk to your health care provider about your target blood pressure.  If you are between 44-51 years old, ask your health care provider if you should take aspirin to prevent heart disease.  Have regular diabetes screenings by checking your fasting blood sugar level. ? If you are at  a normal weight and have a low risk for diabetes, have this test once  every three years after the age of 38. ? If you are overweight and have a high risk for diabetes, consider being tested at a younger age or more often.  A one-time screening for abdominal aortic aneurysm (AAA) by ultrasound is recommended for men aged 65-75 years who are current or former smokers. What should I know about preventing infection? Hepatitis B If you have a higher risk for hepatitis B, you should be screened for this virus. Talk with your health care provider to find out if you are at risk for hepatitis B infection. Hepatitis C Blood testing is recommended for:  Everyone born from [id number] through 1966.  Anyone with known risk factors for hepatitis C.  Sexually Transmitted Diseases (STDs)  You should be screened each year for STDs including gonorrhea and chlamydia if: ? You are sexually active and are younger than 38 years of age. ? You are older than 38 years of age and your health care provider tells you that you are at risk for this type of infection. ? Your sexual activity has changed since you were last screened and you are at an increased risk for chlamydia or gonorrhea. Ask your health care provider if you are at risk.  Talk with your health care provider about whether you are at high risk of being infected with HIV. Your health care provider may recommend a prescription medicine to help prevent HIV infection.  What else can I do?  Schedule regular health, dental, and eye exams.  Stay current with your vaccines (immunizations).  Do not use any tobacco products, such as cigarettes, chewing tobacco, and e-cigarettes. If you need help quitting, ask your health care provider.  Limit alcohol intake to no more than 2 drinks per day. One drink equals 12 ounces of beer, 5 ounces of wine, or 1 ounces of hard liquor.  Do not use street drugs.  Do not share needles.  Ask your health care provider for help  if you need support or information about quitting drugs.  Tell your health care provider if you often feel depressed.  Tell your health care provider if you have ever been abused or do not feel safe at home. This information is not intended to replace advice given to you by your health care provider. Make sure you discuss any questions you have with your health care provider. Document Released: 01/31/2008 Document Revised: 04/02/2016 Document Reviewed: 05/08/2015 Elsevier Interactive Patient Education  2018 [employer]      Edwina BarthMiguel Aamari West, MD [facility]

## 2018-07-02 ENCOUNTER — Encounter: Payer: Self-pay | Admitting: *Deleted

## 2018-07-05 LAB — HEMOGLOBIN A1C
Est. average glucose Bld gHb Est-mCnc: 103 mg/dL
HEMOGLOBIN A1C: 5.2 % (ref 4.8–5.6)

## 2018-07-05 LAB — COMPREHENSIVE METABOLIC PANEL
A/G RATIO: 2.3 — AB (ref 1.2–2.2)
ALT: 50 IU/L — ABNORMAL HIGH (ref 0–44)
AST: 29 IU/L (ref 0–40)
Albumin: 5.2 g/dL (ref 3.5–5.5)
Alkaline Phosphatase: 56 IU/L (ref 39–117)
BILIRUBIN TOTAL: 0.6 mg/dL (ref 0.0–1.2)
BUN / CREAT RATIO: 18 (ref 9–20)
BUN: 15 mg/dL (ref 6–20)
CHLORIDE: 102 mmol/L (ref 96–106)
CO2: 23 mmol/L (ref 20–29)
Calcium: 9.7 mg/dL (ref 8.7–10.2)
Creatinine, Ser: 0.85 mg/dL (ref 0.76–1.27)
GFR calc non Af Amer: 111 mL/min/{1.73_m2} (ref >59)
GFR, EST AFRICAN AMERICAN: 128 mL/min/{1.73_m2} (ref >59)
GLOBULIN, TOTAL: 2.3 g/dL (ref 1.5–4.5)
Glucose: 82 mg/dL (ref 65–99)
POTASSIUM: 4.8 mmol/L (ref 3.5–5.2)
SODIUM: 140 mmol/L (ref 134–144)
TOTAL PROTEIN: 7.5 g/dL (ref 6.0–8.5)

## 2018-07-05 LAB — LIPID PANEL
Chol/HDL Ratio: 5.4 ratio — ABNORMAL HIGH (ref 0.0–5.0)
Cholesterol, Total: 223 mg/dL — ABNORMAL HIGH (ref 100–199)
HDL: 41 mg/dL (ref >39)
LDL Calculated: 167 mg/dL — ABNORMAL HIGH (ref 0–99)
TRIGLYCERIDES: 74 mg/dL (ref 0–149)
VLDL Cholesterol Cal: 15 mg/dL (ref 5–40)

## 2018-07-05 LAB — QUANTIFERON-TB GOLD PLUS
QUANTIFERON NIL VALUE: 0.05 [IU]/mL
QUANTIFERON TB1 AG VALUE: 0.04 [IU]/mL
QuantiFERON Mitogen Value: >10 IU/mL
QuantiFERON TB2 Ag Value: 0.05 IU/mL
QuantiFERON-TB Gold Plus: NEGATIVE

## 2018-11-24 IMAGING — CR DG SINUSES COMPLETE 3+V
4 series · 4 of 4 positions shown · non-contrast
Comparison: No prior

CLINICAL DATA: Sinus pressure and pain.

EXAM:
PARANASAL SINUSES - COMPLETE 3 + VIEW

[[person_name] pa]
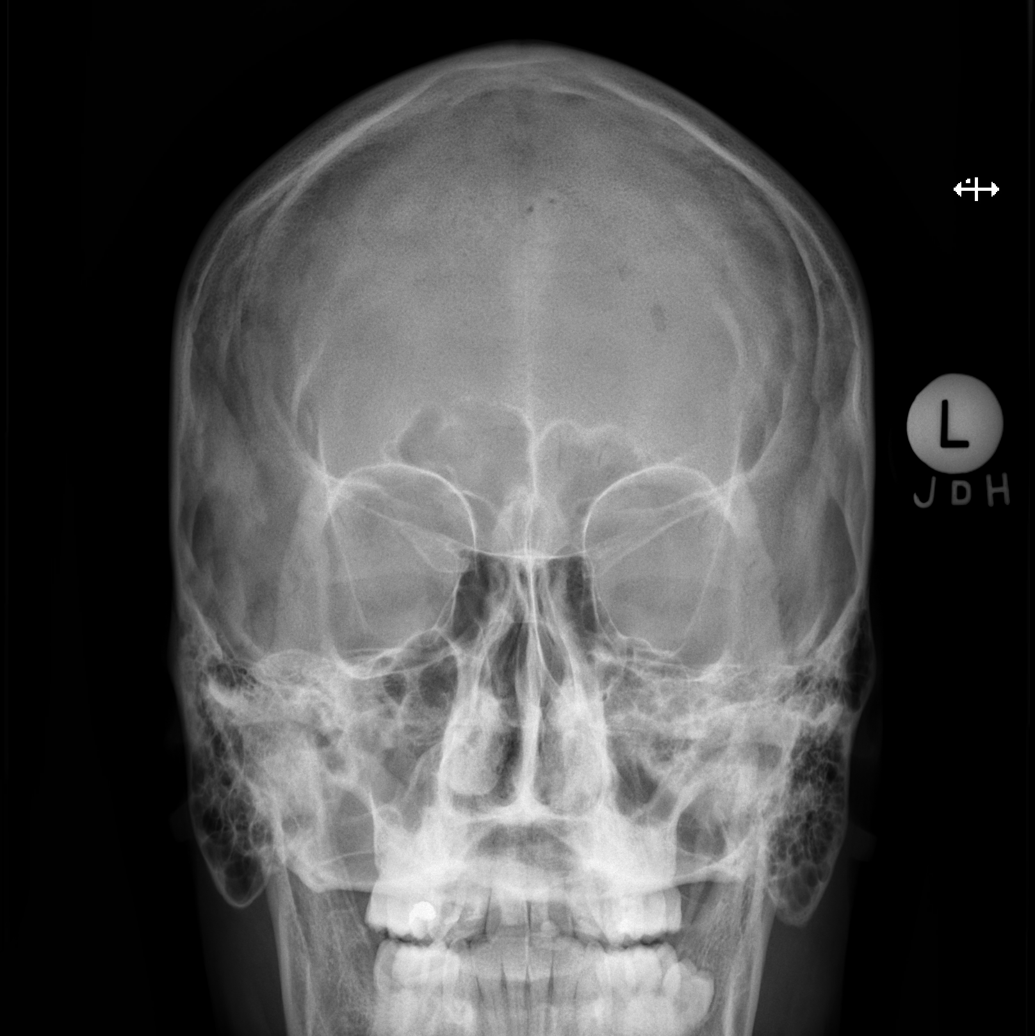

[w waters pa]
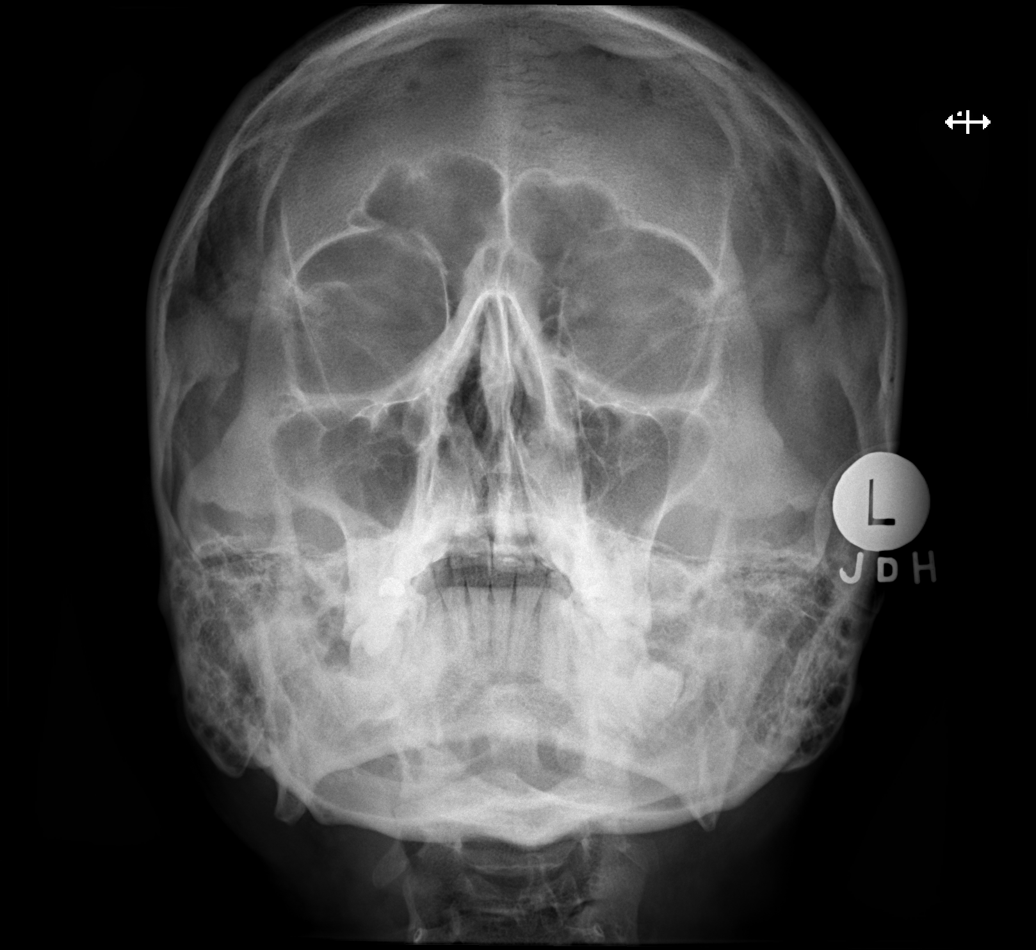

[w skull lat]
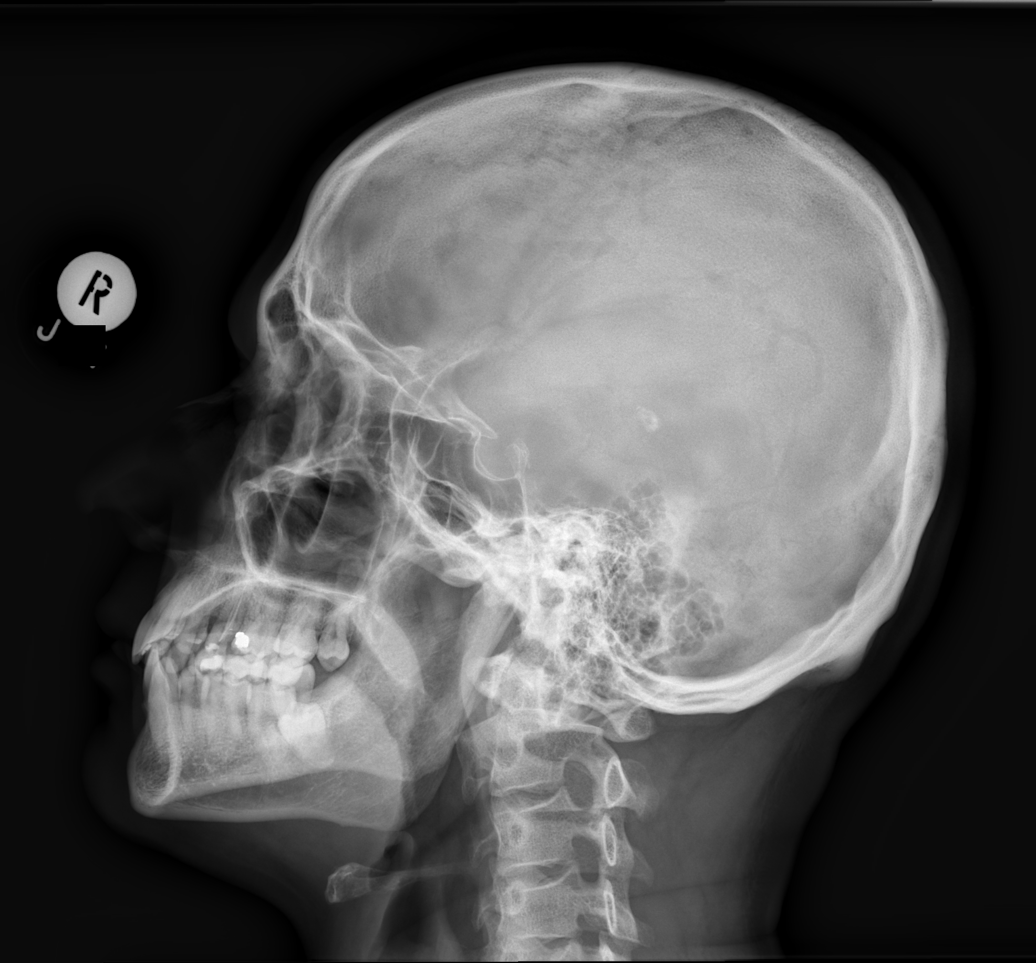

[w smv]
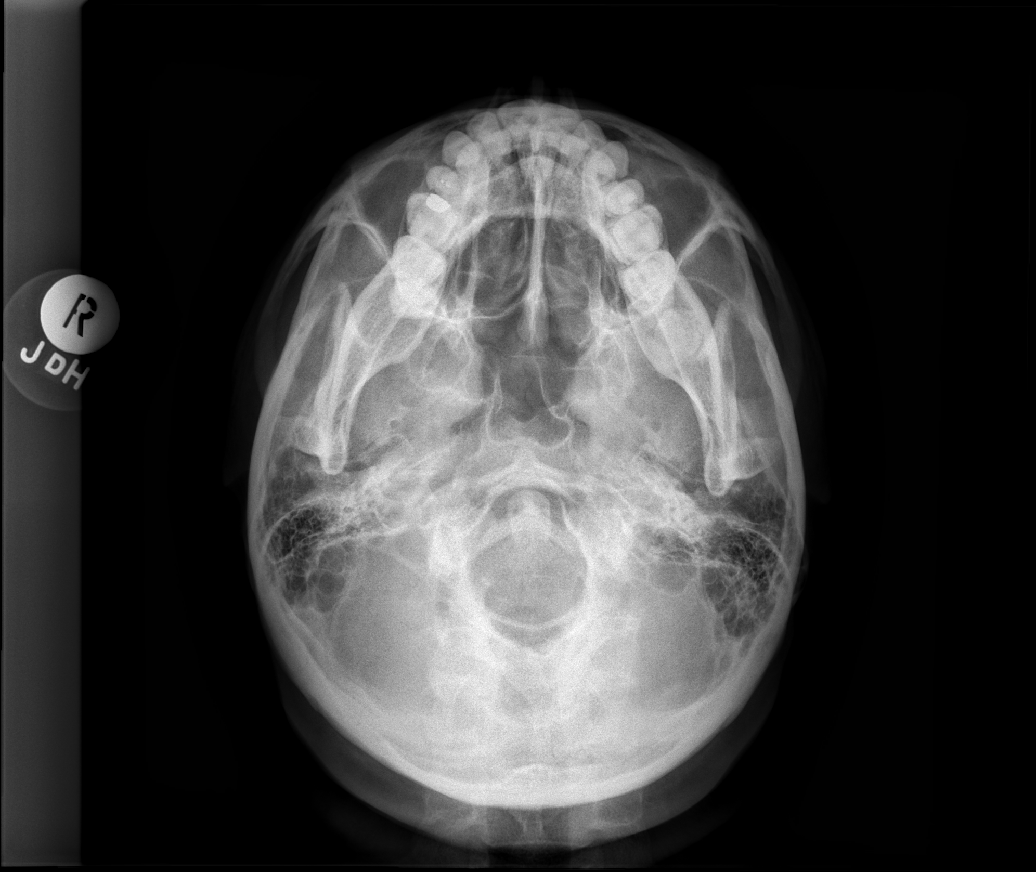

[4 of 4 positions shown; findings below may reference images not displayed]

FINDINGS: Paranasal sinuses are clear. Mastoids are clear. No focal or acute
bony abnormality.
IMPRESSION: Negative exam.

## 2019-03-08 DIAGNOSIS — R5383 Other fatigue: Secondary | ICD-10-CM
